# Patient Record
Sex: Male | Born: 1960
Health system: Southern US, Academic
[De-identification: ages and names within clinical notes are randomized; demographics above are authoritative.]

## PROBLEM LIST (undated history)

## (undated) ENCOUNTER — Ambulatory Visit
Attending: Pharmacist Clinician (PhC)/ Clinical Pharmacy Specialist | Primary: Pharmacist Clinician (PhC)/ Clinical Pharmacy Specialist

## (undated) ENCOUNTER — Ambulatory Visit: Payer: MEDICARE | Attending: Family | Primary: Family

## (undated) ENCOUNTER — Encounter

## (undated) ENCOUNTER — Ambulatory Visit

## (undated) ENCOUNTER — Encounter: Attending: Internal Medicine | Primary: Internal Medicine

## (undated) ENCOUNTER — Telehealth

## (undated) ENCOUNTER — Telehealth: Attending: Family | Primary: Family

## (undated) ENCOUNTER — Ambulatory Visit: Attending: Family | Primary: Family

## (undated) ENCOUNTER — Telehealth: Attending: Internal Medicine | Primary: Internal Medicine

## (undated) DIAGNOSIS — E119 Type 2 diabetes mellitus without complications: Secondary | ICD-10-CM

## (undated) DIAGNOSIS — I10 Essential (primary) hypertension: Secondary | ICD-10-CM

## (undated) DIAGNOSIS — E7800 Pure hypercholesterolemia, unspecified: Secondary | ICD-10-CM

## (undated) DIAGNOSIS — F209 Schizophrenia, unspecified: Secondary | ICD-10-CM

## (undated) DIAGNOSIS — F319 Bipolar disorder, unspecified: Secondary | ICD-10-CM

## (undated) MED ORDER — GABAPENTIN 300 MG CAPSULE: Freq: Three times a day (TID) | ORAL | 0 days | Status: SS

---
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
## 2017-07-19 ENCOUNTER — Ambulatory Visit
Admission: RE | Admit: 2017-07-19 | Discharge: 2017-07-19 | Disposition: A | Discharge status: Home / Self Care | Payer: MEDICAID | Attending: Gastroenterology | Admitting: Gastroenterology

## 2017-07-19 DIAGNOSIS — B1920 Unspecified viral hepatitis C without hepatic coma: Principal | ICD-10-CM

## 2017-07-19 NOTE — Unmapped (Signed)
[facility] L[facility] CENTER    Alan Cook, M.D.  Professor of Medicine  Director, [facility]  [place] [facility] [place]    [phone]    Aimar Borghi    Marguerita Merles, MD  [address]  [facility]  [place], [place] [address]     Chief complaint: Patient is referred for consultation for chronic hepatitis C genotype 1A    Present illness: Patient is a 56 y.o. African-American male with chronic hepatitis C genotype 1A diagnosed in 2001 and he went for routine blood tests with his primary care physician. Patient denies any history of injecting drug use or blood transfusion. He never had acute icteric hepatitis. Overall he feels well aerated he denies nausea, vomiting, abdominal pain, chest pain, or shortness of breath. He's had a voluntary weight loss of about 30 pounds due to improvement in his eating habits. He does note chronic hip pain from degenerative joint disease. He also has burning in his feet she's been related to his diabetes.    10 system review of systems is otherwise negative except as noted above.    Past medical history:  1. History of schizoaffective disorder  2. Diabetes  3. Hypertension  4. 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 Patient has not had a screening colonoscopy. Apparently this was discussed with his primary physician but is opted not to do this at this time.  6. No history of lung disease or heart disease  7. FIBROSCAN 06/2017: 8.9 kps c/w stage F2 moderate fibrosis    No Known Allergies  Current Outpatient Prescriptions   Medication Sig Dispense Refill   ??? BASAGLAR KWIKPEN U-100 INSULIN 100 unit/mL (3 mL) injection pen INJECT 34 UNDER THE SKIN EVERY DAY  4   ??? gabapentin (NEURONTIN) 300 MG capsule Take 300 mg by mouth Three (3) times a day.  0   ??? hydroCHLOROthiazide (HYDRODIURIL) 12.5 MG tablet Take 1 tablet (12.5 mg) by mouth daily in the morning  11   ??? losartan (COZAAR) 100 MG tablet Take 1 tablet (100 mg) by mouth daily  11   ??? metFORMIN (GLUCOPHAGE) 1000 MG tablet Take 1 tablet (1,000 mg) by mouth 2 times per day with meals  0   ??? nicotine (NICODERM CQ) 14 mg/24 hr patch APPLY ONE PATCH TOPICALLY EVERY 24 HOURS AS DIRECTED  1   ??? QUEtiapine (SEROQUEL) 300 MG tablet TAKE TWO TABLETS BY MOUTH AT BEDTIME     ??? senna (SENOKOT) 8.6 mg tablet Take 1 tablet by mouth daily.     ??? losartan (COZAAR) 50 MG tablet Take 1 tablet (50 mg) by mouth daily  0     No current facility-administered medications for this visit.        Social history: The patient formerly drank excessive alcohol (AUDIT score =24).  He discontinued all alcohol 3 months ago when he started to live in the [employer]. He remains committed to abstinence.  He is not currently married. He has 3 children. He smokes half pack of cigarettes per day and is on the patch.    Family history: Negative for liver disease or liver cancer    BP 158/87  - Pulse 78  - Temp 36.7 ??C (98.1 ??F) (Oral)  - Resp 14  - Ht 180.3 cm (5' 11)  - Wt (!) 104.1 kg (229 lb 6.4 oz)  - SpO2 98%  - BMI 31.99 kg/m??  Pleasant individual in NAD, obese    HEENT: Sclera are anicteric, no temporal muscle loss, oropharynx is negative  NECK: No thyromegaly or lymphadenopathy, No carotid bruits  Chest: Clear to auscultation and percussion  Heart: S1, S2, RR, No murmurs  Abdomen: Soft, non-tender, non-distended, no hepatosplenomegaly, no masses appreciated, no ascites  Skin: No spider angiomata, No rashes  Extremities: Without pedal edema, no palmar erythema  Neuro: Grossly intact, No focal deficits    Results for orders placed or performed in visit on 07/19/17   Comprehensive Metabolic Panel   Result Value Ref Range    Sodium 143 135 - 145 mmol/L    Potassium 4.3 3.5 - 5.0 mmol/L    Chloride 107 98 - 107 mmol/L    CO2 24.0 22.0 - 30.0 mmol/L    BUN 16 7 - 21 mg/dL    Creatinine 1.61 0.96 - 1.30 mg/dL    BUN/Creatinine Ratio 16     EGFR MDRD Non Af Amer >=60 >=60 mL/min/1.52m2    EGFR MDRD Af Amer >=60 >=60 mL/min/1.45m2    Anion Gap 12 9 - 15 mmol/L    Glucose 102 65 - 179 mg/dL    Calcium 9.4 8.5 - 04.5 mg/dL    Albumin 3.9 3.5 - 5.0 g/dL    Total Protein 7.4 6.5 - 8.3 g/dL    Total Bilirubin 0.4 0.0 - 1.2 mg/dL    AST 48 19 - 55 U/L    ALT 72 19 - 72 U/L    Alkaline Phosphatase 75 38 - 126 U/L   PT-INR   Result Value Ref Range    PT 10.5 10.2 - 12.8 sec    INR 0.92    Hepatitis B Surface Antigen   Result Value Ref Range    Hepatitis B Surface Ag Nonreactive Nonreactive   Hepatitis B Core Antibody, total   Result Value Ref Range    Hep B Core Total Ab Reactive (A) Nonreactive   Hepatitis B Surface Antibody   Result Value Ref Range    Hep B S Ab Nonreactive Nonreactive, Grayzone    Hepatitis B Surface Ab Quant <8.00 <8.00 m(IU)/mL   Hepatitis A IgG   Result Value Ref Range    Hepatitis A IgG Reactive (A) Nonreactive   CBC w/ Differential   Result Value Ref Range    WBC 7.2 4.5 - 11.0 10*9/L    RBC 4.33 (L) 4.50 - 5.90 10*12/L    HGB 13.5 13.5 - 17.5 g/dL    HCT 40.9 (L) 81.1 - 53.0 %    MCV 91.3 80.0 - 100.0 fL    MCH 31.2 26.0 - 34.0 pg    MCHC 34.2 31.0 - 37.0 g/dL    RDW 91.4 78.2 - 95.6 %    MPV 9.7 7.0 - 10.0 fL    Platelet 370 150 - 440 10*9/L    Absolute Neutrophils 3.5 2.0 - 7.5 10*9/L    Absolute Lymphocytes 3.0 1.5 - 5.0 10*9/L    Absolute Monocytes 0.3 0.2 - 0.8 10*9/L    Absolute Eosinophils 0.2 0.0 - 0.4 10*9/L    Absolute Basophils 0.0 0.0 - 0.1 10*9/L    Large Unstained Cells 3 0 - 4 %         Impression:  1. Chronic hepatitis C genotype 1A.  Today we discussed the natural history of HCV infection, including the risks for progression to cirrhosis, liver failure, liver cancer, and risks of hepatocellular carcinoma. Patient is aware of the  need for continued follow-up and monitoring.  We discussed the importance of remaining abstinent from alcohol due to additive effects on disease progression to cirrhosis.  We discussed potential treatment options that include all-oral regimens with low rates of side effects and high rates of cure (sustained virological response).     Our pharmacist, Erskine Squibb, we'll contact the patient to discuss starting antiviral therapy.    2. Alcohol abuse: Patient is doing well at the [facility]. He appears committed to remaining abstinent from alcohol.    3. Hepatitis A and B immunity: Serologic studies are pending patient will be vaccinated as appropriate at time of his next visit.    4. Obesity: Patient has had a 30 pound voluntary weight loss due to changes in his diet and abstinence from alcohol. We discussed importance of continued weight loss is noted to minimize the risk of fatty liver disease to maintain liver health.    The patient return for follow-up in 3 months or sooner to see Owens Shark, DNP once hepatitis C medications have been started.    Alan Cook, M.D.  Professor of Medicine  Director, [facility]  [place] [facility] [place] at [place]    [phone]

## 2017-07-27 NOTE — Unmapped (Signed)
Counseling for HCV treatment     B18.2 Hep C: yes    K74.60 Cirrhosis: no,   Child Pugh Score if applicable and for Medicaid pts: 5  Z94.4 Liver Transplant: no    Genotype: 1a (Ref Att 06/06/17 pg 4),   HCV RNA: 2,290,000 IU/ml on 05/24/17 (Ref Att 06/06/17 pg 4)   Fibrosis score: F2 (8.9 kPa) on 07/19/17,   HIV Co-infection? no  Signs of liver decompensation? no  Previous treatment? naive    Planned regimen: Epclusa (sofosbuvir/velpatasvir 400/100mg ) x 12 weeks  Reason for NF: DDI with Seroquel and Mavyret  Urgency: Routine Request  Prescribing Provider/NPI: Dr. Gavin Potters / [id number]  Signature waiver form not obtained at this time. Please mail Beneficiary readiness Eval form to patient.  Insurance: Medicaid    Alan Cook is a 56 y.o. who is interested in starting treatment with Epclusa. We discussed the prior authorization (PA) process of obtaining the medication through insurance and that this may take some time.      Current medications:  Current Outpatient Prescriptions   Medication Sig Dispense Refill   ??? BASAGLAR KWIKPEN U-100 INSULIN 100 unit/mL (3 mL) injection pen INJECT 34 UNDER THE SKIN EVERY DAY  4   ??? gabapentin (NEURONTIN) 300 MG capsule Take 300 mg by mouth Three (3) times a day.  0   ??? hydroCHLOROthiazide (HYDRODIURIL) 12.5 MG tablet Take 1 tablet (12.5 mg) by mouth daily in the morning  11   ??? losartan (COZAAR) 100 MG tablet Take 1 tablet (100 mg) by mouth daily  11   ??? losartan (COZAAR) 50 MG tablet Take 1 tablet (50 mg) by mouth daily  0   ??? metFORMIN (GLUCOPHAGE) 1000 MG tablet Take 1 tablet (1,000 mg) by mouth 2 times per day with meals  0   ??? nicotine (NICODERM CQ) 14 mg/24 hr patch APPLY ONE PATCH TOPICALLY EVERY 24 HOURS AS DIRECTED  1   ??? QUEtiapine (SEROQUEL) 300 MG tablet TAKE TWO TABLETS BY MOUTH AT BEDTIME     ??? senna (SENOKOT) 8.6 mg tablet Take 1 tablet by mouth daily.       No current facility-administered medications for this visit.        Following topics were discussed during counseling:    1. Indications for medication, dosage and administration.     A. Epclusa 400/100mg  1 tablet to take daily with or without food.    2. Common side effects of medications and management strategies. (fatigue, headache)    3. Importance of adherence to regimen, follow-up clinic visits and lab monitoring.    4. Drug-drug interaction.    A. Current medications have been reviewed and assessed for possible interaction. Due to potential interaction between Seroquel and Mavyret, Felipa Eth was chosen for treatment.  Denies any antacid/heartburn medications. We discussed the mechanism of drug-drug interaction with acid lowering agents. Advised to check with MD or pharmacist before taking any OTC/herbal medications, with emphasis regarding indigestion/heartburn medications.  Denies use of herbal medication such as milk thistle or St. John's wart.  Allergies have been verified.    5. Med Access: Advised Beneficiary Readiness Evaluation form will be mailed to him. Instructed to complete/sign and return at earliest convenience. Pt will need TW#4 HCV RNA submitted to Medicaid to get full treatment duration (Continuation PA) approved. Stressed importance of TW#4 visit to patient.   6. Importance of informing pharmacy and clinic of updated contact information.  Discussed the process of obtaining medication through specialty pharmacy and  when approved medication will be delivered to patient's home.     Patient verbalized understanding. Provided contact information for any questions/concerns.       Park Breed, Pharm D., BCPS, BCGP, CPP  [facility]  [address]  [place], [place] [address]  [phone]    July 31, 2017 12:29 PM

## 2017-07-31 MED ORDER — SOFOSBUVIR 400 MG-VELPATASVIR 100 MG TABLET: 1 | each | 2 refills | 0 days

## 2017-07-31 MED ORDER — SOFOSBUVIR 400 MG-VELPATASVIR 100 MG TABLET
ORAL_TABLET | Freq: Every day | ORAL | 2 refills | 0.00000 days | Status: CP
Start: 2017-07-31 — End: 2018-01-16

## 2017-08-23 NOTE — Unmapped (Signed)
Patient called to report that he still did not get his beneficiary readiness form for Medicaid in order to start PA approval for HCV Treatment. Advised patient that it was shipped out on 12/5 and 12/12, he is currently staying at [facility] and reports he last checked mail before Christmas.  I advised that he stops by the mail office again today and if he finds the letter to call back and let us know.  Otherwise will request that Sheilah Pigeon resends letter.  Advised that it may be more efficient for patient to stop by clinic and fill out the form since two have already gotten lost in the mail.    Confirmed Address:  [address]   [place] [address]    Alan Cook. Meta Hatchet, PharmD, [facility]  [phone]

## 2017-09-05 MED FILL — EPCLUSA/400-100/TABS: EPCLUSA/400-100/TABS | 28 days supply | Qty: 28 | Fill #0

## 2017-09-05 NOTE — Unmapped (Signed)
Initial Counseling for HCV Treatment     Planned regimen: Epclusa (sofosbuvir/velpatasvir 400/100mg ) x 12 weeks  PA approved til 11/04/17. Required to submit TW#4 HCV RNA for continuation PA.  Planned start date: 09/07/17    Pharmacy: [facility]    Current meds: Insulin, gabapentin, hydrochlorothiazide, losartan, metformin, quetiapine    Patient is ready to start treatment with Epclusa.     Following topics were discussed during counseling:   Patient Counseling    Counseled the patient on the following:  doses and administration discussed, possible adverse effects and management discussed, possible drug and prescription drug interactions discussed, possible drug and OTC drug and food interactions discussed, lab monitoring and follow-up discussed, adherence and missed doses discussed, pharmacy contact information discussed          1. Indications for medication, dosage and administration.     A. Epclusa 400/100mg  1 tablet to take daily with or without food.Patient plans to take between 12-1pm with lunch.     2. Common side effects of medications and management strategies. (fatigue, headache)      3. Importance of adherence to regimen, follow-up clinic visits and lab monitoring.     A. Asked patient to call [place] [facility] [phone] to establish start date for treatment and to schedule appointment 4 weeks before starting treatment.      4. Drug-drug interaction.  Drug Interactions    Drug interactions evaluated:  yes  Clinically relevant drug interactions identified:  no       A. Current medications have been reviewed and assessed for possible interaction.  Denies any antacid/heartburn medications. We discussed the mechanism of drug-drug interaction with acid lowering agents. Advised to check with MD or pharmacist before taking any OTC/herbal medications, with emphasis regarding indigestion/heartburn medications.  Denies use of herbal medication such as milk thistle or St. John's wart.  Allergies have been verified. Denies alcohol.      5. Importance of informing pharmacy and clinic of updated contact information. Advised patient to call pharmacy when down to about 7 day supply left to ensure there's no interruption in therapy.     6. Med Access:  Pt will need TW#4 HCV RNA submitted to Medicaid to get full treatment duration (Continuation PA) approved. Stressed importance of TW#4 visit to patient.    Patient verbalized understanding. Provided contact information for any questions/concerns.     Park Breed, Pharm D., BCPS, BCGP, CPP  [facility]  [address]  [place], [place] [address]  [phone]    September 05, 2017 1:16 PM

## 2017-09-05 NOTE — Unmapped (Signed)
epclusa sent for MFA 12/5, pa approved from 09/05/17 to 11/04/17 for $3

## 2017-09-21 NOTE — Unmapped (Signed)
Follow-Up Counseling for HCV Treatment      Regimen: Epclusa (sofosbuvir/velpatasvir 400/100mg ) x 12 weeks  PA approved til 11/04/17. Required to submit TW#4 HCV RNA for continuation PA.  Start Date: 09/18/17  Treatment Day #3    Pharmacy: [facility] [phone]     Following topics were reviewed during the phone call:  Patient Counseling    Counseled the patient on the following:  doses and administration discussed, possible adverse effects and management discussed, lab monitoring and follow-up discussed, therapeutic rationale discussed, adherence and missed doses discussed, pharmacy contact information discussed         1. Medication administration - Takes Epclusa every day around 12pm-1pm with lunch.    2. Importance of adherence -   Medication Adherence    Specialty Medication:  Epclusa  Patient is on additional specialty medications:  No  Patient is on more than two specialty medications:  No  Gaps in Refill History Greater than 2 Weeks in the Last 3 Months:  No  Demonstrates Understanding of Importance of Adherence:  Yes  Informant:  patient  Medication Assistance Program  Refill Coordination  Shipping Information     Pt states he just started taking his Epclusa on 09/18/17 and has taken 3 doses at this time, which is appropriate. Pt says he remembers to take his Epclusa before he leaves to work at [employer] every day. Stressed importance of staying adherent.    3. Side effects - No new side effects to report. Pt reports he's had migraine symptoms prior to starting Epclusa (~2-3 weeks ago), and has taken [facility] Powder. Advised pt to ensure he is drinking plenty of water throughout the day, and to discontinue BC powder.  Informed pt that he may take up to 2,000mg /day of acetaminophen (4 Tylenol ER tablets/day).      4. Drug-drug interaction - None. No new medications.   Drug Interactions    Drug interactions evaluated:  yes  Clinically relevant drug interactions identified:  no  Provided the patient with educational material regarding drug interactions:  not applicable         5. Follow up - Has follow up appointment scheduled in HCV treatment clinic on 10/19/17 with Owens Shark, DNP, where #TW4 labs will be completed.   Advised patient to call pharmacy when down to about 7 day supply left to ensure there's no interruption in therapy.    All questions were answered.  Pt verbalized understanding.     Casimer Bilis, PharmD Candidate  September 21, 2017 10:52 AM    I reviewed the findings with Pharmacy Student and agree with the findings and counseling as documented in the note.  Park Breed, Pharm D., BCPS, BCGP, CPP  [facility]  [address]  [place], [place] [address]  [phone]

## 2017-09-22 NOTE — Unmapped (Signed)
Patient received medication and made appointment to be seen in clinic at week #4

## 2017-10-05 MED FILL — EPCLUSA/400-100/TABS: EPCLUSA/400-100/TABS | 28 days supply | Qty: 28 | Fill #1

## 2017-10-05 NOTE — Unmapped (Signed)
[facility] Refill Coordination Note  Specialty Medication(s): EPCLUSA  Additional Medications shipped: NO    Alan Cook, DOB: 05-03-1961  Phone: [phone] (home) , Alternate phone contact: N/A  Phone or address changes today?: No  All above HIPAA information was verified with patient.  Shipping Address: [address]  [place] [address]   Insurance changes? No    Completed refill call assessment today to schedule patient's medication shipment from the [facility] [phone]).      Confirmed the medication and dosage are correct and have not changed: Yes, regimen is correct and unchanged.    Confirmed patient started or stopped the following medications in the past month:  No, there are no changes reported at this time.    Are you tolerating your medication?:  Alan Cook reports tolerating the medication.    ADHERENCE    Did you miss any doses in the past 4 weeks? No missed doses reported.    FINANCIAL/SHIPPING    Delivery Scheduled: Yes, Expected medication delivery date: 10/06/17     Alan Cook did not have any additional questions at this time.    Delivery address validated in [facility] scheduling system: Yes, address listed in [facility] is correct.    We will follow up with patient monthly for standard refill processing and delivery.      Thank you,  Alan Cook   [facility] Specialty Pharmacist

## 2017-10-19 ENCOUNTER — Ambulatory Visit: Admit: 2017-10-19 | Discharge: 2017-10-19 | Payer: MEDICARE | Attending: Family | Primary: Family

## 2017-10-19 DIAGNOSIS — B182 Chronic viral hepatitis C: Secondary | ICD-10-CM

## 2017-10-19 DIAGNOSIS — B1920 Unspecified viral hepatitis C without hepatic coma: Principal | ICD-10-CM

## 2017-10-19 DIAGNOSIS — R768 Other specified abnormal immunological findings in serum: Secondary | ICD-10-CM

## 2017-10-19 LAB — HEPATIC FUNCTION PANEL
ALBUMIN: 4.4 g/dL (ref 3.5–5.0)
ALKALINE PHOSPHATASE: 62 U/L (ref 38–126)
ALT (SGPT): 45 U/L (ref 19–72)
AST (SGOT): 31 U/L (ref 19–55)
BILIRUBIN TOTAL: 0.4 mg/dL (ref 0.0–1.2)
PROTEIN TOTAL: 7.7 g/dL (ref 6.5–8.3)

## 2017-10-19 LAB — AST (SGOT): Aspartate aminotransferase:CCnc:Pt:Ser/Plas:Qn:: 31

## 2017-10-19 LAB — POTASSIUM: Potassium:SCnc:Pt:Ser/Plas:Qn:: 4.4

## 2017-10-19 LAB — CBC W/ AUTO DIFF
BASOPHILS ABSOLUTE COUNT: 0 10*9/L (ref 0.0–0.1)
BASOPHILS RELATIVE PERCENT: 0.2 %
EOSINOPHILS RELATIVE PERCENT: 2.4 %
HEMATOCRIT: 39.7 % — ABNORMAL LOW (ref 41.0–53.0)
HEMOGLOBIN: 13 g/dL — ABNORMAL LOW (ref 13.5–17.5)
LARGE UNSTAINED CELLS: 2 % (ref 0–4)
LYMPHOCYTES ABSOLUTE COUNT: 3.6 10*9/L (ref 1.5–5.0)
LYMPHOCYTES RELATIVE PERCENT: 38.9 %
MEAN CORPUSCULAR HEMOGLOBIN CONC: 32.7 g/dL (ref 31.0–37.0)
MEAN CORPUSCULAR HEMOGLOBIN: 29.9 pg (ref 26.0–34.0)
MEAN CORPUSCULAR VOLUME: 91.3 fL (ref 80.0–100.0)
MONOCYTES RELATIVE PERCENT: 5 %
NEUTROPHILS ABSOLUTE COUNT: 4.7 10*9/L (ref 2.0–7.5)
NEUTROPHILS RELATIVE PERCENT: 51 %
PLATELET COUNT: 418 10*9/L (ref 150–440)
RED BLOOD CELL COUNT: 4.35 10*12/L — ABNORMAL LOW (ref 4.50–5.90)
RED CELL DISTRIBUTION WIDTH: 13 % (ref 12.0–15.0)
WBC ADJUSTED: 9.1 10*9/L (ref 4.5–11.0)

## 2017-10-19 LAB — BASIC METABOLIC PANEL
ANION GAP: 10 mmol/L (ref 9–15)
BLOOD UREA NITROGEN: 15 mg/dL (ref 7–21)
BUN / CREAT RATIO: 20
CALCIUM: 9.8 mg/dL (ref 8.5–10.2)
CHLORIDE: 103 mmol/L (ref 98–107)
CO2: 26 mmol/L (ref 22.0–30.0)
EGFR MDRD AF AMER: >=60 mL/min/{1.73_m2} (ref >=60)
EGFR MDRD NON AF AMER: >=60 mL/min/{1.73_m2} (ref >=60)
GLUCOSE RANDOM: 106 mg/dL (ref 65–179)
POTASSIUM: 4.4 mmol/L (ref 3.5–5.0)
SODIUM: 139 mmol/L (ref 135–145)

## 2017-10-19 LAB — BASOPHILS RELATIVE PERCENT: Lab: 0.2

## 2017-10-19 NOTE — Unmapped (Signed)
[facility]    Alan Cook, M.D.  Professor of Medicine  Director, [facility]  [place] [facility] [place] [facility] [place]    [phone]    Marguerita Merles, MD  [address]  [facility]  [place], [place] [address]     Chief complaint: Treatment follow up HCV, genotype 1A  Epclusa x 12 weeks  Start Date: 09/07/2017  TW #6    Present illness: Patient is a 57 y.o. African-American male with chronic hepatitis C genotype 1A diagnosed in 2001 and he went for routine blood tests with his primary care physician. Patient denies any history of injecting drug use or blood transfusion. He never had acute icteric hepatitis. He presents today for treatment follow up ~ TW #6.  He has adhered to taking one tablet of Epclusa daily at 10 pm. Denies having missed any tablets.  Denies any alcohol use. He has not experiencing any potential adverse events with DAA therapy.     He presents with his case worker today.  Denies nausea, vomiting, abdominal pain, chest pain, or shortness of breath. He does note chronic hip pain from degenerative joint disease. He also has burning in his feet she's been related to his diabetes.    ROS: All systems reviewed and negative except as noted in HPI.     Past medical history:  1. History of schizoaffective disorder  2. Diabetes  3. Hypertension  4. 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 Patient has not had a screening colonoscopy. Apparently this was discussed with his primary physician but is opted not to do this at this time.  6. No history of lung disease or heart disease    Liver Care Section:  1. Chronic hepatitis C, treatment naive with genotype 1A  Baseline HCV RNA: 2,290,000 IU/ml on 05/24/17    HCV RNA TW #6- pending     2. FIBROSCAN 06/2017: 8.9 kps c/w stage F2 moderate fibrosis.  3.  Immunity hepatitis A: reactive  4.  Immunity hepatitis B: + core reactive  5.  Liver screening: Abdominal Ultrasound ordered ~ pending result  6.   HIV - pending    No Known Allergies     Current Outpatient Prescriptions   Medication Sig Dispense Refill   ??? BASAGLAR KWIKPEN U-100 INSULIN 100 unit/mL (3 mL) injection pen INJECT 34 UNDER THE SKIN EVERY DAY  4   ??? gabapentin (NEURONTIN) 300 MG capsule Take 300 mg by mouth Three (3) times a day.  0   ??? hydroCHLOROthiazide (HYDRODIURIL) 12.5 MG tablet Take 1 tablet (12.5 mg) by mouth daily in the morning  11   ??? ibuprofen (ADVIL,MOTRIN) 600 MG tablet Take 600 mg by mouth Three (3) times a day.     ??? losartan (COZAAR) 50 MG tablet Take 1 tablet (50 mg) by mouth daily  0   ??? metFORMIN (GLUCOPHAGE) 1000 MG tablet Take 1 tablet (1,000 mg) by mouth 2 times per day with meals  0   ??? QUEtiapine (SEROQUEL) 300 MG tablet TAKE TWO TABLETS BY MOUTH AT BEDTIME     ??? SENNOSIDES ORAL Take 1 tablet by mouth once as needed.      ??? sofosbuvir-velpatasvir (EPCLUSA) tablet Take 1 tablet by mouth daily. 28 tablet 2   ??? VICTOZA 3-PAK 0.6 mg/0.1 mL (18 mg/3 mL) injection INJECT 0.1ML (=0.6MG ) DAILY FOR 1 WEEK, THEN INJECT 0.2ML (=1.2MG ) DAILY FOR 1 WEEK, THEN INJECT 0.3ML (=1.8MG ) DAILY  THEREAFTER  11   ??? losartan (COZAAR) 100 MG tablet Take 1 tablet (100 mg) by mouth daily  11   ??? nicotine (NICODERM CQ) 14 mg/24 hr patch APPLY ONE PATCH TOPICALLY EVERY 24 HOURS AS DIRECTED  1     No current facility-administered medications for this visit.      Social history: The patient formerly drank excessive alcohol (AUDIT score =24).  He discontinued all alcohol 6 months ago when he started to live in the [employer]. He remains committed to abstinence.  He is not currently married. He has 3 children. He is currently smoking 3 cig/daily on average. Trying to quit smoking.     Family history: Negative for liver disease or liver cancer.    Physical Examination:    BP 139/99  - Pulse 78  - Temp 36.9 ??C (98.4 ??F) (Oral)  - Resp 14  - Ht 180.3 cm (5' 10.98)  - Wt (!) 104.3 kg (230 lb)  - SpO2 97%  - BMI 32.09 kg/m??   General: Pleasant individual in NAD, obese, WD, WN.   Neuro: Grossly intact, No focal deficits  Rest of PE deferred.     Laboratory Studies:   Results for orders placed or performed in visit on 10/19/17   Basic metabolic panel   Result Value Ref Range    Sodium 139 135 - 145 mmol/L    Potassium 4.4 3.5 - 5.0 mmol/L    Chloride 103 98 - 107 mmol/L    CO2 26.0 22.0 - 30.0 mmol/L    BUN 15 7 - 21 mg/dL    Creatinine 1.61 0.96 - 1.30 mg/dL    BUN/Creatinine Ratio 20     EGFR MDRD Non Af Amer >=60 >=60 mL/min/1.30m2    EGFR MDRD Af Amer >=60 >=60 mL/min/1.67m2    Anion Gap 10 9 - 15 mmol/L    Glucose 106 65 - 179 mg/dL    Calcium 9.8 8.5 - 04.5 mg/dL   Hepatic Function Panel   Result Value Ref Range    Albumin 4.4 3.5 - 5.0 g/dL    Total Protein 7.7 6.5 - 8.3 g/dL    Total Bilirubin 0.4 0.0 - 1.2 mg/dL    Bilirubin, Direct <4.09 0.00 - 0.40 mg/dL    AST 31 19 - 55 U/L    ALT 45 19 - 72 U/L    Alkaline Phosphatase 62 38 - 126 U/L   Hepatitis B Surface Antigen   Result Value Ref Range    Hepatitis B Surface Ag Nonreactive Nonreactive   CBC w/ Differential   Result Value Ref Range    WBC 9.1 4.5 - 11.0 10*9/L    RBC 4.35 (L) 4.50 - 5.90 10*12/L    HGB 13.0 (L) 13.5 - 17.5 g/dL    HCT 81.1 (L) 91.4 - 53.0 %    MCV 91.3 80.0 - 100.0 fL    MCH 29.9 26.0 - 34.0 pg    MCHC 32.7 31.0 - 37.0 g/dL    RDW 78.2 95.6 - 21.3 %    MPV 8.3 7.0 - 10.0 fL    Platelet 418 150 - 440 10*9/L    Neutrophils % 51.0 %    Lymphocytes % 38.9 %    Monocytes % 5.0 %    Eosinophils % 2.4 %    Basophils % 0.2 %    Absolute Neutrophils 4.7 2.0 - 7.5 10*9/L    Absolute Lymphocytes 3.6 1.5 - 5.0 10*9/L  Absolute Monocytes 0.5 0.2 - 0.8 10*9/L    Absolute Eosinophils 0.2 0.0 - 0.4 10*9/L    Absolute Basophils 0.0 0.0 - 0.1 10*9/L    Large Unstained Cells 2 0 - 4 %       Impression/Plan:  1. Chronic hepatitis C, treatment naive, genotype 1A.  Alan Cook is a pleasant 57 yo AA gentleman who presents today for treatment follow up ~ TW #6.  He has adhered 100 percent to taking one tablet of Epclusa daily. Denies having missed any doses.  No alcohol use for the past six months. Remains committed to sobriety. Denies having experienced any potential adverse events with DAA therapy. Overall, he states he has been doing well. Fibrosis score ~ 8.9 kPa/F2.     ~ HCV safety labs ordered.   ~ Office follow up seven weeks for SVR.   ~ Epclusa one tablet daily. Avoid missing any doses.   ~ No alcohol use or anti acid medications during treatment. Congratulated on continued sobriety.     2. Alcohol assessment: No use. AUDIT score= zero.    3. Hepatitis A and B immunity: + reactive status to hepatitis A. + hepatitis B core positive ~ hepatitis B surface antigen ordered.     4. Obesity ~ Encouraged continued healthy dietary habits and regular exercise program.    5.  Liver screening: New order placed for Abd ultrasound. Scheduled December 11, 2017 at 9:15 AM    All patient's questions were answered to his satisfaction during office visit today.     Rodman Key, MSN, FNP-BC  [facility]  [address] [address]  [place] [address]  Phone [phone]

## 2017-10-19 NOTE — Unmapped (Signed)
1.  Laboratory studies done today as part of hepatitis C care.   2.  Office follow up seven weeks for end of treatment visit.   3.  Continue taking Epclusa one tablet daily. Avoid missing any doses.   4.  No alcohol use.   5. A void any medications for acid reflux while on treatment.   6.  Any questions please let me know.   7.  Remain scheduled for upcoming abdominal ultrasound.

## 2017-10-20 LAB — HIV ANTIGEN/ANTIBODY COMBO: HIV 1+2 Ab+HIV1 p24 Ag:PrThr:Pt:Ser/Plas:Ord:IA: NONREACTIVE

## 2017-10-20 LAB — HEPATITIS B SURFACE ANTIGEN: Hepatitis B virus surface Ag:PrThr:Pt:Ser:Ord:: NONREACTIVE

## 2017-10-23 LAB — HEPATITIS C RNA, QUANTITATIVE, PCR: HCV RNA: NOT DETECTED

## 2017-10-23 LAB — HCV RNA COMMENT: Lab: 0

## 2017-10-26 NOTE — Unmapped (Signed)
-----   Message from [facility] [place] sent at 10/24/2017  5:03 PM EST -----  Regarding: lab  Please let Alan Cook know that his HCV RNA level is not detected at TW #6.  He needs to continue treatment as prescribed and follow up as scheduled for his EOT visit please.     Dawn

## 2017-10-26 NOTE — Unmapped (Signed)
LVM regarding GI lab results. will try again later.

## 2017-10-31 NOTE — Unmapped (Signed)
Follow-Up Counseling for HCV Treatment      Regimen: Epclusa (sofosbuvir/velpatasvir 400/100mg ) x 12 weeks  Start Date: 09/18/17  Completed Treatment Week #6    Pharmacy: [facility] [phone]       Following topics were reviewed during the phone call:  Patient Counseling    Counseled the patient on the following:  doses and administration discussed, possible adverse effects and management discussed, possible drug and prescription drug interactions discussed, possible drug and OTC drug and food interactions discussed, lab monitoring and follow-up discussed, therapeutic rationale discussed, adherence and missed doses discussed, pharmacy contact information discussed         1. Medication administration - Takes Epclusa every day around 12:00 PM with lunch.     2. Importance of adherence -   Medication Adherence    Patient reported X missed doses in the last month:  0  Specialty Medication:  Epclusa  Patient is on additional specialty medications:  No  Patient is on more than two specialty medications:  No  Any gaps in refill history greater than 2 weeks in the last 3 months:  no  Demonstrates understanding of importance of adherence:  yes  Informant:  patient  Reliability of informant:  reliable  Provider-estimated medication adherence level:  90-100%  Patient is at risk for Non-Adherence:  No  Reasons for non-adherence:  no problems identified   Other adherence tool:  patient takes at lunch time with his other medications   Confirmed plan for next specialty medication refill:  delivery by pharmacy  Refills needed for supportive medications:  yes, ordered or provider notified       Patient was unable to provide pill count but does not recall missing any doses. Patient was instructed to call back today with his pill count.     3. Side effects - Patient denies any new side effects. Patient continues to take 2 tylenol in the morning and 2 tylenol in the evening for a pinched nerve in his L4-L5 region. Patient stated he is drinking plenty of water.   Adverse Effects    *All other systems reviewed and are negative         4. Drug-drug interaction - Patient denies starting any new medications.   Drug Interactions    Drug interactions evaluated:  yes  Clinically relevant drug interactions identified:  no         5. Follow up - Has follow up appointment scheduled in HCV treatment clinic on 12/25/17 @ 0900 with Owens Shark, DNP. Patient is also scheduled for an ultrasound on 12/11/17  at 0900. Patient was instructed to be NPO for 6 hrs prior to ultrasound. Patient's address was confirmed on EPIC. Delivery scheduled for 11/03/17. Patient was notified there is still a small chance of relapse after finishing the treatment. Stressed importance of follow up 3 months post treatment to assess for cure.  Shipping Information    Delivery Scheduled:  Yes  Delivery Date:  11/03/17  Medications to be Shipped:  Epclusa       Refill Coordination    Has the Patients' Contact Information Changed:  No  Is the Shipping Address Different:  No       All questions were answered.      Vertell Limber RN, [facility]   Pharmacy Adult GI Medicine  [facility]  [address]   [place], [place] [address]  [phone]    October 31, 2017 10:41  AM

## 2017-10-31 NOTE — Unmapped (Signed)
LVM regarding GI lab results and follow up reminder.

## 2017-10-31 NOTE — Unmapped (Signed)
-----   Message from [facility] [place] sent at 10/24/2017  5:03 PM EST -----  Regarding: lab  Please let Alan Cook know that his HCV RNA level is not detected at TW #6.  He needs to continue treatment as prescribed and follow up as scheduled for his EOT visit please.     Dawn

## 2017-11-01 MED FILL — EPCLUSA/400-100/TABS: EPCLUSA/400-100/TABS | 28 days supply | Qty: 28 | Fill #2

## 2017-12-04 NOTE — Unmapped (Signed)
Follow-Up Counseling for HCV Treatment      Regimen: Epclusa (sofosbuvir/velpatasvir 400/100mg ) x 12 weeks  Start Date: 09/18/17  Completed Treatment Week #11    Pharmacy: [facility] [phone]       Following topics were reviewed during the phone call:  Patient Counseling    Counseled the patient on the following:  doses and administration discussed, possible adverse effects and management discussed, possible drug and prescription drug interactions discussed, possible drug and OTC drug and food interactions discussed, lab monitoring and follow-up discussed, therapeutic rationale discussed, adherence and missed doses discussed, pharmacy contact information discussed         1. Medication administration - Takes Epclusa at 1PM before he leaves for work every day. Patient takes with his other medications.     2. Importance of adherence -   Medication Adherence    Patient reported X missed doses in the last month:  0  Specialty Medication:  Epclusa  Patient is on additional specialty medications:  No  Patient is on more than two specialty medications:  No  Any gaps in refill history greater than 2 weeks in the last 3 months:  no  Demonstrates understanding of importance of adherence:  yes  Informant:  patient  Reliability of informant:  reliable  Provider-estimated medication adherence level:  90-100%  Patient is at risk for Non-Adherence:  No  Reasons for non-adherence:  no problems identified  Adherence tools used:  directed education   Other adherence tool:  patient takes at lunch time with his other medications        Patient was not able to provide pill count but denies missing any doses.     3. Side effects - Patient denies any side effects.   Adverse Effects    *All other systems reviewed and are negative         4. Drug-drug interaction - Patient denies starting any new medications.   Drug Interactions    Drug interactions evaluated:  yes  Clinically relevant drug interactions identified:  no         5. Follow up - Has follow up appointment scheduled in HCV treatment clinic on 12/25/17 @ 0900 with Owens Shark, DNP. Patient also reminded of his ultrasound scheduled on 12/11/17 @ 0915. Patient instructed to be NPO for 6 hrs prior to ultrasound. Informed patient that there is still a small chance of relapse after finishing the treatment. Stressed importance of follow up 3 months post treatment to assess for cure.      All questions were answered.      Vertell Limber RN, [facility]   Pharmacy Adult GI Medicine  [facility]  [address]   [place], [place] [address]  [phone]    December 04, 2017 9:38 AM

## 2017-12-11 ENCOUNTER — Ambulatory Visit: Admit: 2017-12-11 | Discharge: 2017-12-11 | Payer: MEDICARE

## 2017-12-11 DIAGNOSIS — B1920 Unspecified viral hepatitis C without hepatic coma: Principal | ICD-10-CM

## 2018-01-16 ENCOUNTER — Ambulatory Visit: Admit: 2018-01-16 | Discharge: 2018-01-16 | Payer: MEDICARE | Attending: Family | Primary: Family

## 2018-01-16 DIAGNOSIS — R768 Other specified abnormal immunological findings in serum: Secondary | ICD-10-CM

## 2018-01-16 DIAGNOSIS — B182 Chronic viral hepatitis C: Principal | ICD-10-CM

## 2018-02-27 ENCOUNTER — Ambulatory Visit: Admit: 2018-02-27 | Discharge: 2018-02-28 | Payer: MEDICARE | Attending: Family | Primary: Family

## 2018-02-27 DIAGNOSIS — R768 Other specified abnormal immunological findings in serum: Secondary | ICD-10-CM

## 2018-02-27 DIAGNOSIS — B182 Chronic viral hepatitis C: Principal | ICD-10-CM

## 2018-02-27 DIAGNOSIS — K740 Hepatic fibrosis: Secondary | ICD-10-CM

## 2018-08-31 ENCOUNTER — Ambulatory Visit: Admit: 2018-08-31 | Discharge: 2018-08-31 | Payer: MEDICARE

## 2018-08-31 DIAGNOSIS — K740 Hepatic fibrosis: Principal | ICD-10-CM

## 2018-09-04 ENCOUNTER — Ambulatory Visit: Admit: 2018-09-04 | Discharge: 2018-09-05 | Payer: MEDICARE | Attending: Family | Primary: Family

## 2018-09-04 DIAGNOSIS — B182 Chronic viral hepatitis C: Principal | ICD-10-CM

## 2018-09-04 DIAGNOSIS — Z8619 Personal history of other infectious and parasitic diseases: Secondary | ICD-10-CM

## 2018-10-12 ENCOUNTER — Ambulatory Visit: Admit: 2018-10-12 | Discharge: 2018-10-13 | Payer: MEDICARE

## 2018-10-12 DIAGNOSIS — R748 Abnormal levels of other serum enzymes: Principal | ICD-10-CM

## 2019-03-27 MED ORDER — HYDROCHLOROTHIAZIDE 12.5 MG TABLET
ORAL_TABLET | 4 refills | 0 days | Status: CP
Start: 2019-03-27 — End: ?

## 2019-03-27 MED ORDER — LOSARTAN 100 MG TABLET
ORAL_TABLET | 4 refills | 0 days | Status: CP
Start: 2019-03-27 — End: ?

## 2020-12-01 ENCOUNTER — Ambulatory Visit
Admit: 2020-12-01 | Discharge: 2020-12-11 | Disposition: A | Discharge status: Rehabilitation Facility | Payer: MEDICARE | Source: Other Acute Inpatient Hospital | Admitting: Psychiatry

## 2020-12-09 MED ORDER — BLOOD-GLUCOSE METER KIT WRAPPER
0 refills | 0 days | Status: CP
Start: 2020-12-09 — End: 2021-12-09

## 2020-12-09 MED ORDER — BLOOD GLUCOSE TEST STRIPS
ORAL_STRIP | 0 refills | 0 days | Status: CP
Start: 2020-12-09 — End: ?

## 2020-12-09 MED ORDER — LANCETS
0 refills | 0 days | Status: CP
Start: 2020-12-09 — End: ?

## 2020-12-10 MED ORDER — BLOOD-GLUCOSE METER KIT WRAPPER
0 refills | 0 days | Status: CP
Start: 2020-12-10 — End: 2021-12-10

## 2020-12-10 MED ORDER — BLOOD GLUCOSE TEST STRIPS
ORAL_STRIP | 0 refills | 0 days | Status: CP
Start: 2020-12-10 — End: ?

## 2020-12-10 MED ORDER — QUETIAPINE 300 MG TABLET
ORAL_TABLET | Freq: Every evening | ORAL | 0 refills | 30 days | Status: CP
Start: 2020-12-10 — End: 2021-01-09

## 2020-12-10 MED ORDER — LANCETS
0 refills | 0 days | Status: CP
Start: 2020-12-10 — End: ?

## 2020-12-16 ENCOUNTER — Other Ambulatory Visit: Payer: Self-pay

## 2020-12-16 ENCOUNTER — Ambulatory Visit (INDEPENDENT_AMBULATORY_CARE_PROVIDER_SITE_OTHER): Payer: Medicare Other | Admitting: Psychiatry

## 2020-12-16 ENCOUNTER — Encounter (HOSPITAL_COMMUNITY): Payer: Self-pay

## 2020-12-16 ENCOUNTER — Encounter (HOSPITAL_COMMUNITY): Payer: Self-pay | Admitting: Psychiatry

## 2020-12-16 ENCOUNTER — Ambulatory Visit (HOSPITAL_COMMUNITY)
Admission: EM | Admit: 2020-12-16 | Discharge: 2020-12-16 | Disposition: A | Discharge status: Home / Self Care | Payer: Medicare Other

## 2020-12-16 VITALS — BP 162/97 | HR 93

## 2020-12-16 DIAGNOSIS — E1165 Type 2 diabetes mellitus with hyperglycemia: Secondary | ICD-10-CM

## 2020-12-16 DIAGNOSIS — F250 Schizoaffective disorder, bipolar type: Secondary | ICD-10-CM | POA: Insufficient documentation

## 2020-12-16 DIAGNOSIS — Z794 Long term (current) use of insulin: Secondary | ICD-10-CM | POA: Diagnosis not present

## 2020-12-16 DIAGNOSIS — Z760 Encounter for issue of repeat prescription: Secondary | ICD-10-CM | POA: Diagnosis not present

## 2020-12-16 HISTORY — DX: Essential (primary) hypertension: I10

## 2020-12-16 HISTORY — DX: Pure hypercholesterolemia, unspecified: E78.00

## 2020-12-16 HISTORY — DX: Type 2 diabetes mellitus without complications: E11.9

## 2020-12-16 MED ORDER — "PEN NEEDLES 5/16"" 30G X 8 MM MISC": 1.0000 "application " | Freq: Every day | 2 refills | Status: AC

## 2020-12-16 MED ORDER — BUPROPION HCL ER (XL) 300 MG PO TB24: 300.0000 mg | ORAL_TABLET | Freq: Every morning | ORAL | 2 refills | Status: AC

## 2020-12-16 MED ORDER — QUETIAPINE FUMARATE 300 MG PO TABS: 600.0000 mg | ORAL_TABLET | Freq: Every day | ORAL | 2 refills | Status: AC

## 2020-12-16 MED ORDER — GABAPENTIN 300 MG PO CAPS: 300.0000 mg | ORAL_CAPSULE | Freq: Three times a day (TID) | ORAL | 2 refills | Status: AC

## 2020-12-16 MED ORDER — INSULIN GLARGINE 100 UNIT/ML ~~LOC~~ SOLN: 20.0000 [IU] | Freq: Every day | SUBCUTANEOUS | 0 refills | Status: AC

## 2020-12-16 MED ORDER — LIRAGLUTIDE 18 MG/3ML ~~LOC~~ SOPN: 1.8000 mg | PEN_INJECTOR | Freq: Every day | SUBCUTANEOUS | 2 refills | Status: AC

## 2020-12-16 NOTE — ED Notes (Signed)
TC from [facility] requesting a TO to change insulin pen needle from 30 g to 31 gauge. VO per Hr B. Hagler

## 2020-12-16 NOTE — ED Provider Notes (Signed)
[facility]   MRN: [id number] DOB: August 09, 1961  Subjective:   Lucas Moran is a 60 y.o. male presenting for medication refill of his Lantus, Victoza. Also needs pen needles. He is currently in the process of establishing care with a new PCP. Reports that he is managing his diabetes well. His blood sugar levels have been in the 130s-150s. Denies chest pain, shob, dizziness, vision changes, abdominal pain, hematuria. No history of CKD.   No current facility-administered medications for this encounter. No current outpatient medications on file.   No Known Allergies  Past Medical History:  Diagnosis Date  . Diabetes (HCC)   . High cholesterol   . Hypertension      History reviewed. No pertinent surgical history.  History reviewed. No pertinent family history.  Social History   Tobacco Use  . Smoking status: Former Games developer  . Smokeless tobacco: Never Used  Substance Use Topics  . Alcohol use: Not Currently  . Drug use: Not Currently    ROS   Objective:   Vitals: BP (!) 157/91 (BP Location: Right Arm)   Pulse 89   Temp 98 F (36.7 C) (Oral)   Resp 17   SpO2 98%   Physical Exam Constitutional:      General: He is not in acute distress.    Appearance: Normal appearance. He is well-developed. He is not ill-appearing, toxic-appearing or diaphoretic.  HENT:     Head: Normocephalic and atraumatic.     Right Ear: External ear normal.     Left Ear: External ear normal.     Nose: Nose normal.     Mouth/Throat:     Mouth: Mucous membranes are moist.     Pharynx: Oropharynx is clear.  Eyes:     General: No scleral icterus.       Right eye: No discharge.        Left eye: No discharge.     Extraocular Movements: Extraocular movements intact.     Conjunctiva/sclera: Conjunctivae normal.     Pupils: Pupils are equal, round, and reactive to light.  Cardiovascular:     Rate and Rhythm: Normal rate and regular rhythm.     Heart sounds: Normal heart  sounds. No murmur heard. No friction rub. No gallop.   Pulmonary:     Effort: Pulmonary effort is normal. No respiratory distress.     Breath sounds: Normal breath sounds. No stridor. No wheezing, rhonchi or rales.  Skin:    General: Skin is warm and dry.  Neurological:     Mental Status: He is alert and oriented to person, place, and time.  Psychiatric:        Mood and Affect: Mood normal.        Behavior: Behavior normal.        Thought Content: Thought content normal.        Judgment: Judgment normal.     Assessment and Plan :   PDMP not reviewed this encounter.  1. Type 2 diabetes mellitus with hyperglycemia, with long-term current use of insulin (HCC)   2. Encounter for medication refill     Printed scripts provided to her as he requested. Recommended diabetic friendly diet. Contact [facility] to establish care asap. Also placed him in PCP Assistance program. Otherwise, physical exam findings and vital stable for outpatient management. Counseled patient on potential for adverse effects with medications prescribed/recommended today, ER and return-to-clinic precautions discussed, patient verbalized understanding.    Wallis Bamberg, PA-C  12/16/20 0922  

## 2020-12-16 NOTE — ED Triage Notes (Addendum)
Pt is here today for a refill on his diabetes medication. Lantus 100 units/mL and Victoza. Pt is asking for a paper prescription to take to any Pharmacy.
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
## 2020-12-21 ENCOUNTER — Encounter: Payer: Self-pay | Admitting: *Deleted

## 2020-12-31 ENCOUNTER — Ambulatory Visit (HOSPITAL_COMMUNITY): Payer: Medicare Other | Admitting: Licensed Clinical Social Worker

## 2020-12-31 ENCOUNTER — Telehealth (HOSPITAL_COMMUNITY): Payer: Self-pay | Admitting: Licensed Clinical Social Worker

## 2020-12-31 NOTE — Telephone Encounter (Signed)
Sent two links to pt phone with no response. Then f/u with a phone call person answered and stated this was the wrong number.

## 2021-01-05 ENCOUNTER — Encounter (HOSPITAL_COMMUNITY): Payer: Self-pay

## 2021-01-05 ENCOUNTER — Ambulatory Visit (HOSPITAL_COMMUNITY)
Admission: EM | Admit: 2021-01-05 | Discharge: 2021-01-05 | Disposition: A | Discharge status: Home / Self Care | Payer: Medicare Other | Attending: Emergency Medicine | Admitting: Emergency Medicine

## 2021-01-05 ENCOUNTER — Other Ambulatory Visit: Payer: Self-pay

## 2021-01-05 DIAGNOSIS — S8980XA Other specified injuries of unspecified lower leg, initial encounter: Secondary | ICD-10-CM

## 2021-01-05 HISTORY — DX: Bipolar disorder, unspecified: F31.9

## 2021-01-05 HISTORY — DX: Schizophrenia, unspecified: F20.9

## 2021-01-05 MED ORDER — TIZANIDINE HCL 4 MG PO TABS: 4.0000 mg | ORAL_TABLET | Freq: Three times a day (TID) | ORAL | 0 refills | Status: DC | PRN

## 2021-01-05 MED ORDER — MELOXICAM 15 MG PO TABS: 15.0000 mg | ORAL_TABLET | Freq: Every day | ORAL | 0 refills | Status: AC

## 2021-01-05 NOTE — ED Triage Notes (Signed)
Pt presents with bilateral knee pain x 2-*3 weeks. 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 Ibuprofen gives no relief, 6 am today was last dose.

## 2021-01-05 NOTE — Discharge Instructions (Signed)
I suspect your symptoms are from overuse.  Take the Mobic in the morning with breakfast.  Take 1000 mg of Tylenol with it.  You may take an additional 1000 mg of Tylenol at lunch, dinner, and before bedtime if needed.  Ice your knees after use.  Compression sleeves are a good idea.  Rest for the next 2 days.  Follow-up with sports medicine if you are not better with conservative treatment in a week.

## 2021-01-05 NOTE — ED Provider Notes (Signed)
HPI  SUBJECTIVE:  Lucas Moran is a 60 y.o. male who presents with constant bilateral knee pain located primarily at the insertion of the quadriceps for the past 2 weeks after starting a new job.  States that it sometimes radiates into his inner thighs.  Reports swelling of the knees.  He denies trauma, erythema, fevers, clicking, new crepitus.  His knees have not given way.  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  He started this job 2-1/2 weeks ago.  He has been alternating 600 mg with 1000 mg of Tylenol once daily and has tried  Neurontin that he states is prescribed to him for schizophrenia/schizoaffective disorder..  No alleviating factors.  Symptoms are worse with walking with a heavy load.  He has a past medical history of diabetes, hypercholesterolemia, hypertension, drug abuse, is currently clean and in a rehab program, bilateral knee pain.  No history of peptic ulcer disease, chronic kidney disease.  PMD: Pcp, None     Past Medical History:  Diagnosis Date  . Bipolar 1 disorder (HCC)   . Diabetes (HCC)   . High cholesterol   . Hypertension   . Schizophrenia (HCC)     History reviewed. No pertinent surgical history.  History reviewed. No pertinent family history.  Social History   Tobacco Use  . Smoking status: Former Games developer  . Smokeless tobacco: Never Used  Substance Use Topics  . Alcohol use: Not Currently  . Drug use: Never    No current facility-administered medications for this encounter.  Current Outpatient Medications:  .  meloxicam (MOBIC) 15 MG tablet, Take 1 tablet (15 mg total) by mouth daily for 7 days., Disp: 7 tablet, Rfl: 0 .  tiZANidine (ZANAFLEX) 4 MG tablet, Take 1 tablet (4 mg total) by mouth every 8 (eight) hours as needed for muscle spasms., Disp: 30 tablet, Rfl: 0 .  atorvastatin (LIPITOR) 20 MG tablet, Take 20 mg by mouth at bedtime., Disp: , Rfl:  .  buPROPion (WELLBUTRIN XL) 300 MG 24 hr  tablet, Take 1 tablet (300 mg total) by mouth in the morning., Disp: 30 tablet, Rfl: 2 .  gabapentin (NEURONTIN) 300 MG capsule, Take 1 capsule (300 mg total) by mouth 3 (three) times daily., Disp: 90 capsule, Rfl: 2 .  hydrochlorothiazide (HYDRODIURIL) 12.5 MG tablet, Take 12.5 mg by mouth daily., Disp: , Rfl:  .  insulin glargine (LANTUS) 100 UNIT/ML injection, Inject 0.2 mLs (20 Units total) into the skin daily., Disp: 6 mL, Rfl: 0 .  Insulin Pen Needle (PEN NEEDLES 5/16") 30G X 8 MM MISC, 1 application by Does not apply route daily., Disp: 100 each, Rfl: 2 .  liraglutide (VICTOZA) 18 MG/3ML SOPN, Inject into the skin., Disp: , Rfl:  .  liraglutide (VICTOZA) 18 MG/3ML SOPN, Inject 1.8 mg into the skin daily., Disp: 9 mL, Rfl: 2 .  metFORMIN (GLUCOPHAGE) 1000 MG tablet, Take 1,000 mg by mouth 2 (two) times daily with a meal., Disp: , Rfl:  .  QUEtiapine (SEROQUEL) 300 MG tablet, Take 2 tablets (600 mg total) by mouth at bedtime., Disp: 60 tablet, Rfl: 2  No Known Allergies   ROS  As noted in HPI.   Physical Exam  BP (!) 153/88 (BP Location: Left Arm)   Pulse 85   Temp 98.3 F (36.8 C) (Oral)   Resp 20   SpO2 96%   Constitutional: Well developed, well nourished, no acute distress Eyes:  EOMI, conjunctiva normal bilaterally HENT: Normocephalic, atraumatic,mucus membranes moist Respiratory: Normal inspiratory effort Cardiovascular: Normal rate GI: nondistended skin: No rash, skin intact Musculoskeletal:  L Knee: Tenderness at the insertion of the quadriceps.  No other tenderness along the quadriceps.  No soft tissue defect.  ROM baseline for Pt , Flexion  intact ,  Patella NT, Patellar tendon NT, Medial joint NT , Lateral joint NT, Popliteal region NT, Varus MCL stress testing stable, Valgus LCL stress testing stable, McMurray's testing normal , Lachman's negative. Distal NVI with intact baseline sensation / motor / pulse distal to knee.  No effusion. No erythema. No increased  temperature. No crepitus.   R Knee: Tenderness at the insertion of the quadriceps.  No other tenderness along the quadriceps.  No soft tissue defect. ROM baseline for Pt , Flexion  intact,  Patella NT,  Patellar tendon NT, Medial joint NT , Lateral joint NT / tender, Popliteal region NT, Varus MCL stress testing stable, Valgus LCL stress testing stable, McMurray's testing normal, Lachman's negative. Distal NVI with intact baseline sensation / motor / pulse distal to knee.  No effusion. No erythema. No increased temperature. No crepitus.   Neurologic: Alert & oriented x 3, no focal neuro deficits Psychiatric: Speech and behavior appropriate   ED Course   Medications - No data to display  No orders of the defined types were placed in this encounter.   No results found for this or any previous visit (from the past 24 hour(s)). No results found.  ED Clinical Impression  1. Overuse injury of lower leg      ED Assessment/Plan  Patient with tenderness at the insertion of the quadriceps bilaterally.  There is no soft tissue defect.  Doubt quadricep rupture.  Suspect overuse syndrome as he went from no activity to heavy physical activity 8 hours a day, 5 days a week.  Will send home with Zanaflex, Mobic/Tylenol, may take an additional 1000 mg of Tylenol 3 times a day as needed, work note for 2 days.  Ice after use.  Follow-up with sports medicine if not better with conservative treatment.  He has no bony tenderness, no history of trauma to the knee.  Do not think that we need x-rays today.  Doubt fracture.  There is no evidence of infection  Discussed MDM, treatment plan, and plan for follow-up with patient.  patient agrees with plan.   Meds ordered this encounter  Medications  . tiZANidine (ZANAFLEX) 4 MG tablet    Sig: Take 1 tablet (4 mg total) by mouth every 8 (eight) hours as needed for muscle spasms.    Dispense:  30 tablet    Refill:  0  . meloxicam (MOBIC) 15 MG tablet    Sig:  Take 1 tablet (15 mg total) by mouth daily for 7 days.    Dispense:  7 tablet    Refill:  0      *This clinic note was created using Scientist, clinical (histocompatibility and immunogenetics). Therefore, there may be occasional mistakes despite careful proofreading.  ?    Domenick Gong, MD 01/06/21 [phone]

## 2021-01-11 ENCOUNTER — Ambulatory Visit (INDEPENDENT_AMBULATORY_CARE_PROVIDER_SITE_OTHER): Payer: Medicare Other | Admitting: Family Medicine

## 2021-01-11 ENCOUNTER — Other Ambulatory Visit: Payer: Self-pay

## 2021-01-11 ENCOUNTER — Encounter: Payer: Self-pay | Admitting: Family Medicine

## 2021-01-11 VITALS — BP 140/90 | Ht 71.0 in | Wt 225.0 lb

## 2021-01-11 DIAGNOSIS — M25561 Pain in right knee: Secondary | ICD-10-CM

## 2021-01-11 DIAGNOSIS — M25562 Pain in left knee: Secondary | ICD-10-CM | POA: Diagnosis not present

## 2021-01-11 DIAGNOSIS — G8929 Other chronic pain: Secondary | ICD-10-CM

## 2021-01-11 MED ORDER — METHYLPREDNISOLONE ACETATE 40 MG/ML IJ SUSP
40.0000 mg | Freq: Once | INTRAMUSCULAR | Status: AC
  Administered 2021-01-11: 40 mg via INTRA_ARTICULAR

## 2021-01-11 NOTE — Patient Instructions (Signed)
Your pain is due to arthritis. Get x-rays after you leave today. These are the different medications you can take for this: Tylenol 500mg  1-2 tabs three times a day for pain. Capsaicin, aspercreme, or biofreeze topically up to four times a day may also help with pain. Some supplements that may help for arthritis: Boswellia extract, curcumin, pycnogenol Cortisone injections are an option - you were given these today. If cortisone injections do not help, there are different types of shots that may help but they take longer to take effect. It's important that you continue to stay active. Straight leg raises, knee extensions 3 sets of 10 once a day (add ankle weight if these become too easy). Start physical therapy to strengthen muscles around the joint that hurts to take pressure off of the joint itself. Heat or ice 15 minutes at a time 3-4 times a day as needed to help with pain. Water aerobics and cycling with low resistance are the best two types of exercise for arthritis though any exercise is ok as long as it doesn't worsen the pain. Follow up with me in 1 month for reevaluation.

## 2021-01-11 NOTE — Progress Notes (Signed)
    SUBJECTIVE:   CHIEF COMPLAINT / HPI:   Right knee pain  Present since 2014. Went to PT in [place] and was kind of helpful.  Worse with activity. Pain with sitting for long periods of time.  Pain in suprapatellar area, m/l joint line, and under patella.  Hurts worse with activity. Especially with squatting or with weightbearing. Pain in left knee as well and similar to right pain.  Received a muscle relaxer from [facility] reports that that when taking it with his regular medication, others told him he was acting differently and he lost some periods of time.  PERTINENT  PMH / PSH: Drug addiction (in recovery), diabetes type 2 - insulin dependent.   OBJECTIVE:   BP 140/90   Ht 5\' 11"  (1.803 m)   Wt 225 lb (102.1 kg)   BMI 31.38 kg/m   Knee:  Inspection - No gross abnormlaities. Anterior shins with large areas of dried skin.  Palpation - TTP of suprapatellar area, m/l joint lines (mild warmth), and posterior knee bilaterally. Patient with lateral pain worse in right knee. No palpable fluid collection bilaterally. ROM - Full active & passive ROM. Pain with full knee flexion and extension bilaterally (pain R>L).  Strength - 5/5 strength flexion, extension.  Special Tests - Normal anterior/posterior drawer. No valgus/varus laxity. + Thessaly on right knee, neg on left. Mcmurray + with pain in lateral knee.    ASSESSMENT/PLAN:   B/L Knee pain  Physical exam is consistent with likely arthritis in bilateral knees. No concerns for fracture or ligamental injury bilaterally. Right knee with + thessaly, and history consistent with likely indicating meniscal pathology. Discussed conservative management. Pt agreeable with PT, which he has done in the past with good improvement. - B/L knee x-rays  -Referral for physical therapy -Note for work for limitations -Bilateral knee injections done today -Follow-up in 4 weeks  After informed written consent timeout was performed, patient was seated on  exam table. Right knee was prepped with alcohol swab and utilizing anteromedial approach, patient's right knee was injected intraarticularly with 3:1 lidocaine: depomedrol. Patient tolerated the procedure well without immediate complications.  After informed written consent timeout was performed, patient was seated on exam table. Left knee was prepped with alcohol swab and utilizing anteromedial approach, patient's left knee was injected intraarticularly with 3:1 lidocaine: depomedrol. Patient tolerated the procedure well without immediate complications.  Also counseled patient on obtaining PCP in [place] given diabetes.   [place], MD [facility]

## 2021-01-12 ENCOUNTER — Ambulatory Visit
Admission: RE | Admit: 2021-01-12 | Discharge: 2021-01-12 | Disposition: A | Discharge status: Home / Self Care | Payer: Medicare Other | Source: Ambulatory Visit | Attending: Family Medicine | Admitting: Family Medicine

## 2021-01-12 DIAGNOSIS — M25562 Pain in left knee: Secondary | ICD-10-CM

## 2021-01-12 DIAGNOSIS — G8929 Other chronic pain: Secondary | ICD-10-CM

## 2021-01-13 ENCOUNTER — Ambulatory Visit: Payer: Self-pay | Admitting: Nurse Practitioner

## 2021-01-27 ENCOUNTER — Ambulatory Visit: Payer: Self-pay | Admitting: Nurse Practitioner

## 2021-02-08 ENCOUNTER — Ambulatory Visit (INDEPENDENT_AMBULATORY_CARE_PROVIDER_SITE_OTHER): Payer: Medicare Other | Admitting: Family Medicine

## 2021-02-08 ENCOUNTER — Other Ambulatory Visit: Payer: Self-pay

## 2021-02-08 VITALS — BP 132/80 | Ht 71.0 in | Wt 226.0 lb

## 2021-02-08 DIAGNOSIS — M25562 Pain in left knee: Secondary | ICD-10-CM | POA: Diagnosis not present

## 2021-02-08 DIAGNOSIS — G8929 Other chronic pain: Secondary | ICD-10-CM | POA: Diagnosis not present

## 2021-02-08 DIAGNOSIS — M25561 Pain in right knee: Secondary | ICD-10-CM

## 2021-02-08 MED ORDER — TIZANIDINE HCL 4 MG PO TABS: 4.0000 mg | ORAL_TABLET | Freq: Three times a day (TID) | ORAL | 2 refills | Status: AC | PRN

## 2021-02-08 NOTE — Patient Instructions (Addendum)
Your pain is due to arthritis. The shots can be repeated up to every 3 months if needed. Start physical therapy to strengthen muscles around the joint that hurts to take pressure off of the joint itself - call the number below directly - they have the order already. Do home exercises they will teach you on days you don't go to therapy. These are the different medications you can take for this: Tylenol 500mg  1-2 tabs three times a day for pain. Capsaicin, aspercreme, or biofreeze topically up to four times a day may also help with pain. Some supplements that may help for arthritis: Boswellia extract, curcumin, pycnogenol Follow up with me as needed.  [facility] at [facility] [address] [place], [place] [phone]

## 2021-02-08 NOTE — Progress Notes (Signed)
  Lucas Moran - 60 y.o. male MRN [id number]  Date of birth: 21-May-1961  SUBJECTIVE:    CC: Bilateral knee pain   5/16 Present since 2014. Went to PT in [place] and was kind of helpful. Worse with activity. Pain with sitting for long periods of time. Pain in suprapatellar area, m/l joint line, and under patella. Hurts worse with activity. Especially with squatting or with weightbearing. Received a muscle relaxer from [facility] reports that that when taking it with his regular medication, others told him he was acting differently and he lost some periods of time.  6/13 Both knees much better after injection last time.  He has been wearing a brace on the right knee but not on the left.  Still working which aggravates the pain sometimes.  Never able to get in touch with PT. Some catching and popping of knees but overall better.  Still takes muscle relaxer prn but infrequent.   Objective:  VS: BP:132/80  HR: bpm  TEMP: ( )  RESP:   HT:5\' 11"  (180.3 cm)   WT:226 lb (102.5 kg)  BMI:31.53 PHYSICAL EXAM:  Well appearing male in NAD.  Right Knee: No effusion or erythema.  TTP at the medial joint line.  FROM in flexion and extension with minimal pain.  5/5 strength in flexion and extension.  Positive Mcmurrays. Negative anterior drawer, valgus/varus testing.   Left Knee: No effusion or erythema.  TTP at the medial joint line.  FROM in flexion and extension with minimal pain.  5/5 strength in flexion and extension.  Negative Mcmurrays. Negative anterior drawer, valgus/varus testing.    ASSESSMENT & PLAN:  Bilateral Knee Pain: His OA is much improved since the injection. It will be beneficial for him to do some PT but he has been unable to connect with them. We will give him their number today for him to reach out. He can continue to take his zanalfex as needed, we will refill today. He can use tylenol or topical creams for pain. He will follow up with [place] as needed.   Aurelio Jew, MS4

## 2021-02-09 ENCOUNTER — Encounter: Payer: Self-pay | Admitting: Family Medicine

## 2021-02-12 ENCOUNTER — Ambulatory Visit: Payer: Self-pay | Admitting: Nurse Practitioner

## 2021-02-15 ENCOUNTER — Ambulatory Visit: Payer: Medicare Other

## 2021-03-17 ENCOUNTER — Encounter (HOSPITAL_COMMUNITY): Payer: Medicare Other | Admitting: Psychiatry

## 2021-03-31 ENCOUNTER — Ambulatory Visit: Payer: Self-pay | Admitting: Nurse Practitioner

## 2022-03-31 IMAGING — CR DG KNEE STANDING AP BILAT
1 series · 1 of 1 positions shown · non-contrast
Comparison: None.

CLINICAL DATA: Bilateral knee pain without trauma

EXAM:
BILATERAL KNEES STANDING - 1 VIEW

[w knee ap left]
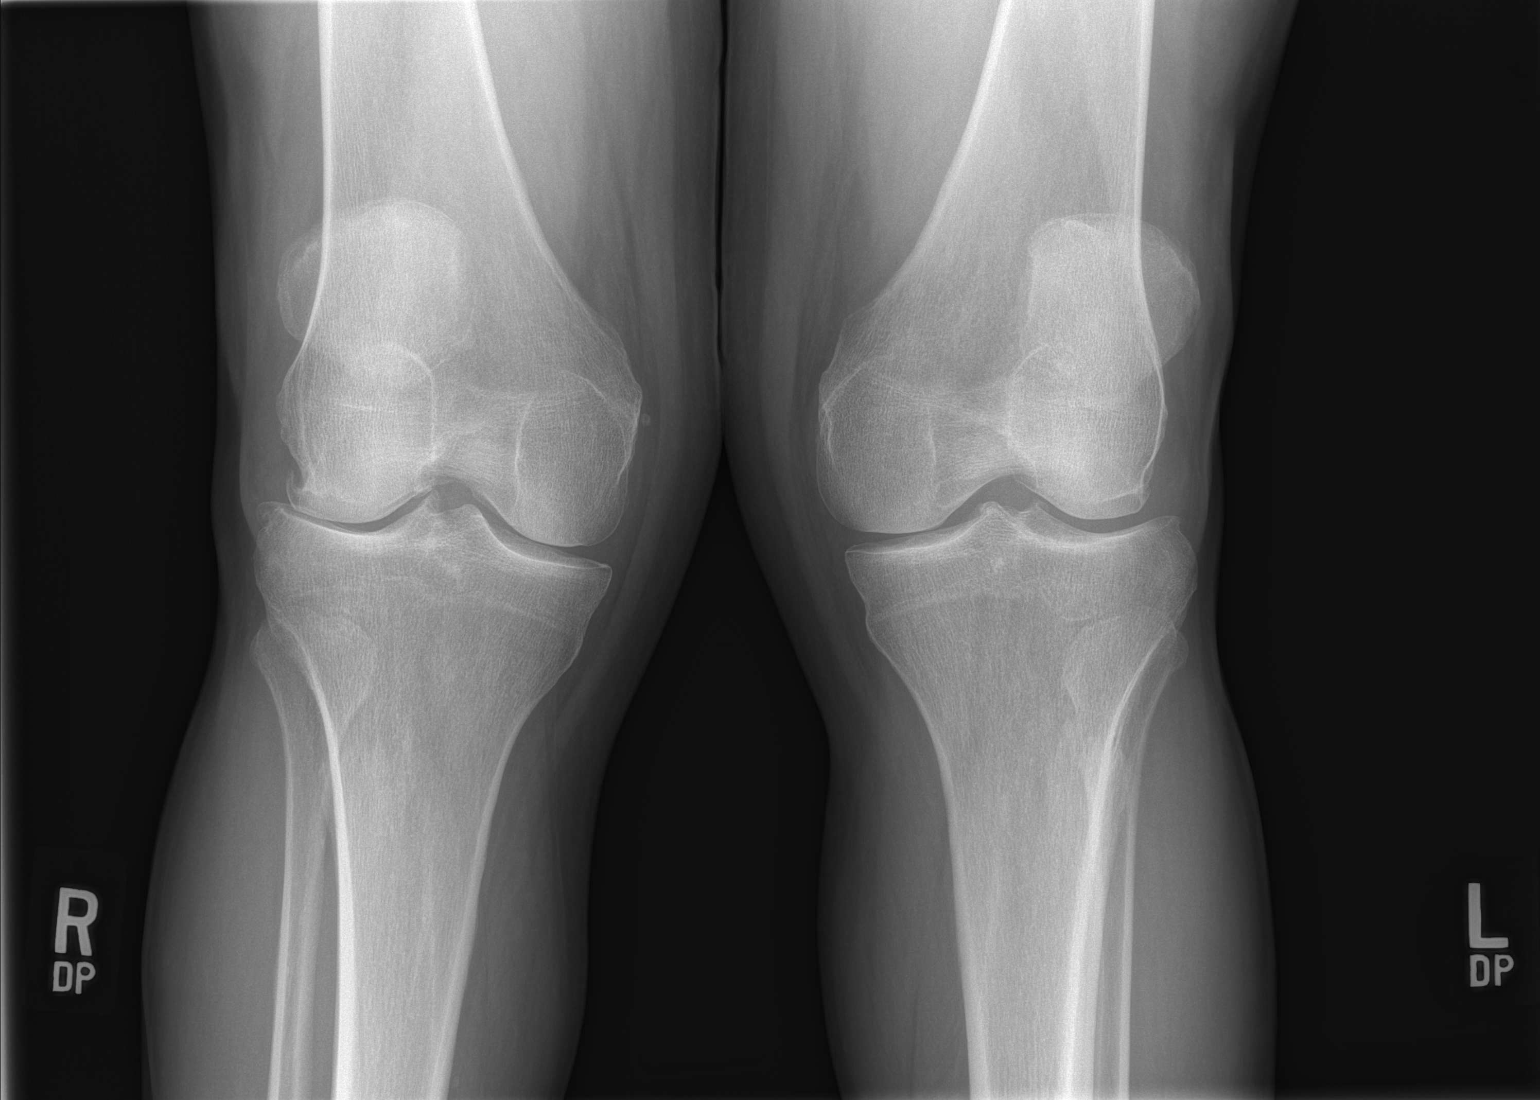

[1 of 1 positions shown; findings below may reference images not displayed]

FINDINGS: Right knee: AP view of the right knee demonstrates moderate lateral
compartment joint space narrowing and osteophyte formation. Mild
medial compartment joint space narrowing.

Left knee: AP view demonstrates mild medial and minimal lateral
compartment joint space narrowing.
IMPRESSION: Osteoarthritis, primarily involving the lateral compartment of the
right knee.
